# Patient Record
Sex: Female | Born: 1990 | Race: Asian | Hispanic: No | Marital: Single | Smoking: Never smoker
Health system: Southern US, Community
[De-identification: ages and names within clinical notes are randomized; demographics above are authoritative.]

## PROBLEM LIST (undated history)

## (undated) DIAGNOSIS — T7840XA Allergy, unspecified, initial encounter: Secondary | ICD-10-CM

## (undated) HISTORY — DX: Allergy, unspecified, initial encounter: T78.40XA

---
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
## 2013-11-03 ENCOUNTER — Ambulatory Visit (INDEPENDENT_AMBULATORY_CARE_PROVIDER_SITE_OTHER): Payer: BC Managed Care – PPO | Admitting: Physician Assistant

## 2013-11-03 VITALS — BP 118/58 | HR 87 | Temp 98.7°F | Resp 16 | Ht 64.0 in | Wt 151.6 lb

## 2013-11-03 DIAGNOSIS — R059 Cough, unspecified: Secondary | ICD-10-CM

## 2013-11-03 DIAGNOSIS — R05 Cough: Secondary | ICD-10-CM

## 2013-11-03 MED ORDER — HYDROCODONE-HOMATROPINE 5-1.5 MG/5ML PO SYRP: 5.0000 mL | ORAL_SOLUTION | Freq: Three times a day (TID) | ORAL | Status: DC | PRN

## 2013-11-03 MED ORDER — BENZONATATE 100 MG PO CAPS: 100.0000 mg | ORAL_CAPSULE | Freq: Three times a day (TID) | ORAL | Status: DC | PRN

## 2013-11-03 NOTE — Progress Notes (Signed)
   Subjective:    Patient ID: Alexa Perkins, female    DOB: 12-Mar-1991, 23 y.o.   MRN: [id number]  HPI   Alexa Perkins is a very pleasant 23 yr old female here complaining of 1 wk of cough.  For the past 2 days she has been getting into coughing spasms and can't stop coughing.  The cough keeps her awake at night.  Because she has been coughing so much she now has a headache and sore throat.  Her chest hurts because she has been coughing so much.  Cough is non-productive.  Some associated runny nose as well.  No fever.  Dayquil and Nyquil for symptoms, neither of which have helped.  Denies SOB or wheezing.  No asthma history.  No smoking.  Has had some friends sick with similar symptoms.  States that she really feels pretty well, just can't stop coughing.   Review of Systems  Constitutional: Negative for fever and chills.  HENT: Positive for congestion, rhinorrhea and sore throat.   Respiratory: Positive for cough. Negative for shortness of breath and wheezing.   Cardiovascular: Positive for chest pain (with cough).  Gastrointestinal: Negative.   Musculoskeletal: Negative.   Skin: Negative.   Neurological: Positive for headaches.       Objective:   Physical Exam  Vitals reviewed. Constitutional: She is oriented to person, place, and time. She appears well-developed and well-nourished. No distress.  HENT:  Head: Normocephalic and atraumatic.  Right Ear: Tympanic membrane and ear canal normal.  Left Ear: Tympanic membrane and ear canal normal.  Mouth/Throat: Uvula is midline, oropharynx is clear and moist and mucous membranes are normal.  Eyes: Conjunctivae are normal. No scleral icterus.  Neck: Neck supple.  Cardiovascular: Normal rate, regular rhythm and normal heart sounds.   Pulmonary/Chest: Effort normal and breath sounds normal. She has no wheezes. She has no rales.  Lymphadenopathy:    She has no cervical adenopathy.  Neurological: She is alert and oriented to person, place, and  time.  Skin: Skin is warm and dry.  Psychiatric: She has a normal mood and affect. Her behavior is normal.       Assessment & Plan:  Cough - Plan: benzonatate (TESSALON) 100 MG capsule, HYDROcodone-homatropine (HYCODAN) 5-1.5 MG/5ML syrup   Alexa Perkins is a very pleasant 23 yr old female here with with 1 wk of non-productive cough.  Afebrile ,VSS, lungs CTA.  Suspect viral/post-viral cough.  Will treat with Tessalon, Hycodan.  Push fluids, rest.  Use throat drops for sore throat relief.  Discussed RTC precautions  E. Frances FurbishElizabeth Travaughn Vue MHS, PA-C [facility] 1/8/20151:52 PM

## 2013-11-03 NOTE — Patient Instructions (Signed)
Begin using the benzonatate (Tessalon Perles) every 8 hours as needed for cough.  Use the Hycodan syrup at bedtime - will likely make you sleepy, so try only at bed time first.  Drink plenty of water.  Use cough drops/throat drops to help your sore throat.  If anything is worsening or not improving, please let us know

## 2013-11-07 ENCOUNTER — Ambulatory Visit (INDEPENDENT_AMBULATORY_CARE_PROVIDER_SITE_OTHER): Payer: BC Managed Care – PPO | Admitting: Internal Medicine

## 2013-11-07 VITALS — BP 122/78 | HR 84 | Temp 98.8°F | Resp 17 | Ht 64.0 in | Wt 152.6 lb

## 2013-11-07 DIAGNOSIS — R059 Cough, unspecified: Secondary | ICD-10-CM

## 2013-11-07 DIAGNOSIS — R05 Cough: Secondary | ICD-10-CM

## 2013-11-07 DIAGNOSIS — J209 Acute bronchitis, unspecified: Secondary | ICD-10-CM

## 2013-11-07 DIAGNOSIS — H6690 Otitis media, unspecified, unspecified ear: Secondary | ICD-10-CM

## 2013-11-07 MED ORDER — AZITHROMYCIN 500 MG PO TABS: 500.0000 mg | ORAL_TABLET | Freq: Every day | ORAL | Status: DC

## 2013-11-07 MED ORDER — HYDROCODONE-ACETAMINOPHEN 7.5-325 MG/15ML PO SOLN: 10.0000 mL | Freq: Four times a day (QID) | ORAL | Status: DC | PRN

## 2013-11-07 NOTE — Progress Notes (Signed)
   Subjective:    Patient ID: Alexa Perkins, female    DOB: Mar 05, 1991, 23 y.o.   MRN: [id number]  HPI 23 y.o. Female presents to clinic for follow up of cough and sore throat. Was seen 4 days ago for same symptoms and prescribed hycodan and benzonatate. States that she feels some what better but cant seem to get rid of cough. Cough is productive with mucus. Uses Afrin for sinuses every night at bedtime. Symptoms have persisted for past 1-2 weeks. Feels like symptoms are in both chest and throat .   Review of Systems     Objective:   Physical Exam  Vitals reviewed. Constitutional: She appears well-developed and well-nourished. No distress.  HENT:  Head: Normocephalic.  Right Ear: There is swelling and tenderness. Tympanic membrane is bulging. Tympanic membrane is not injected, not erythematous and not retracted. Tympanic membrane mobility is abnormal. A middle ear effusion is present. Decreased hearing is noted.  Left Ear: External ear normal.  Nose: Mucosal edema, rhinorrhea and sinus tenderness present.  Mouth/Throat: Oropharynx is clear and moist.  Cardiovascular: Normal rate, regular rhythm and normal heart sounds.   Pulmonary/Chest: Effort normal and breath sounds normal. She has no wheezes. She has no rales. She exhibits no tenderness.          Assessment & Plan:  Rhinitis Medicamentosa will deal with later Bronchitis/AOM

## 2013-11-07 NOTE — Patient Instructions (Signed)
Otitis Media, Adult Otitis media is redness, soreness, and swelling (inflammation) of the middle ear. Otitis media may be caused by allergies or, most commonly, by infection. Often it occurs as a complication of the common cold. SIGNS AND SYMPTOMS Symptoms of otitis media may include:  Earache.  Fever.  Ringing in your ear.  Headache.  Leakage of fluid from the ear. DIAGNOSIS To diagnose otitis media, your health care provider will examine your ear with an otoscope. This is an instrument that allows your health care provider to see into your ear in order to examine your eardrum. Your health care provider also will ask you questions about your symptoms. TREATMENT  Typically, otitis media resolves on its own within 3 5 days. Your health care provider may prescribe medicine to ease your symptoms of pain. If otitis media does not resolve within 5 days or is recurrent, your health care provider may prescribe antibiotic medicines if he or she suspects that a bacterial infection is the cause. HOME CARE INSTRUCTIONS   Take your medicine as directed until it is gone, even if you feel better after the first few days.  Only take over-the-counter or prescription medicines for pain, discomfort, or fever as directed by your health care provider.  Follow up with your health care provider as directed. SEEK MEDICAL CARE IF:  You have otitis media only in one ear or bleeding from your nose or both.  You notice a lump on your neck.  You are not getting better in 3 5 days.  You feel worse instead of better. SEEK IMMEDIATE MEDICAL CARE IF:   You have pain that is not controlled with medicine.  You have swelling, redness, or pain around your ear or stiffness in your neck.  You notice that part of your face is paralyzed.  You notice that the bone behind your ear (mastoid) is tender when you touch it. MAKE SURE YOU:   Understand these instructions.  Will watch your condition.  Will get help  right away if you are not doing well or get worse. Document Released: 07/18/2004 Document Revised: 08/03/2013 Document Reviewed: 05/10/2013 [facility] Patient Information 2014 [place], [place]. Bronchitis Bronchitis is inflammation of the airways that extend from the windpipe into the lungs (bronchi). The inflammation often causes mucus to develop, which leads to a cough. If the inflammation becomes severe, it may cause shortness of breath. CAUSES  Bronchitis may be caused by:   Viral infections.   Bacteria.   Cigarette smoke.   Allergens, pollutants, and other irritants.  SIGNS AND SYMPTOMS  The most common symptom of bronchitis is a frequent cough that produces mucus. Other symptoms include:  Fever.   Body aches.   Chest congestion.   Chills.   Shortness of breath.   Sore throat.  DIAGNOSIS  Bronchitis is usually diagnosed through a medical history and physical exam. Tests, such as chest X-rays, are sometimes done to rule out other conditions.  TREATMENT  You may need to avoid contact with whatever caused the problem (smoking, for example). Medicines are sometimes needed. These may include:  Antibiotics. These may be prescribed if the condition is caused by bacteria.  Cough suppressants. These may be prescribed for relief of cough symptoms.   Inhaled medicines. These may be prescribed to help open your airways and make it easier for you to breathe.   Steroid medicines. These may be prescribed for those with recurrent (chronic) bronchitis. HOME CARE INSTRUCTIONS  Get plenty of rest.   Drink enough  fluids to keep your urine clear or pale yellow (unless you have a medical condition that requires fluid restriction). Increasing fluids may help thin your secretions and will prevent dehydration.   Only take over-the-counter or prescription medicines as directed by your health care provider.  Only take antibiotics as directed. Make sure you finish them even if  you start to feel better.  Avoid secondhand smoke, irritating chemicals, and strong fumes. These will make bronchitis worse. If you are a smoker, quit smoking. Consider using nicotine gum or skin patches to help control withdrawal symptoms. Quitting smoking will help your lungs heal faster.   Put a cool-mist humidifier in your bedroom at night to moisten the air. This may help loosen mucus. Change the water in the humidifier daily. You can also run the hot water in your shower and sit in the bathroom with the door closed for 5 10 minutes.   Follow up with your health care provider as directed.   Wash your hands frequently to avoid catching bronchitis again or spreading an infection to others.  SEEK MEDICAL CARE IF: Your symptoms do not improve after 1 week of treatment.  SEEK IMMEDIATE MEDICAL CARE IF:  Your fever increases.  You have chills.   You have chest pain.   You have worsening shortness of breath.   You have bloody sputum.  You faint.  You have lightheadedness.  You have a severe headache.   You vomit repeatedly. MAKE SURE YOU:   Understand these instructions.  Will watch your condition.  Will get help right away if you are not doing well or get worse. Document Released: 10/13/2005 Document Revised: 08/03/2013 Document Reviewed: 06/07/2013 [facility] Patient Information 2014 [place], [place].

## 2013-11-07 NOTE — Progress Notes (Signed)
   Subjective:    Patient ID: Alexa Perkins, female    DOB: Mar 08, 1991, 23 y.o.   MRN: [id number]  HPI    Review of Systems     Objective:   Physical Exam        Assessment & Plan:

## 2013-12-24 ENCOUNTER — Ambulatory Visit (INDEPENDENT_AMBULATORY_CARE_PROVIDER_SITE_OTHER): Payer: BC Managed Care – PPO | Admitting: Family Medicine

## 2013-12-24 VITALS — BP 110/70 | HR 87 | Temp 98.1°F | Resp 18 | Ht >= 80 in | Wt 154.5 lb

## 2013-12-24 DIAGNOSIS — J309 Allergic rhinitis, unspecified: Secondary | ICD-10-CM

## 2013-12-24 DIAGNOSIS — J029 Acute pharyngitis, unspecified: Secondary | ICD-10-CM

## 2013-12-24 DIAGNOSIS — J111 Influenza due to unidentified influenza virus with other respiratory manifestations: Secondary | ICD-10-CM

## 2013-12-24 DIAGNOSIS — R05 Cough: Secondary | ICD-10-CM

## 2013-12-24 DIAGNOSIS — R059 Cough, unspecified: Secondary | ICD-10-CM

## 2013-12-24 LAB — POCT RAPID STREP A (OFFICE): Rapid Strep A Screen: NEGATIVE

## 2013-12-24 LAB — POCT INFLUENZA A/B
Influenza A, POC: POSITIVE
Influenza B, POC: NEGATIVE

## 2013-12-24 MED ORDER — HYDROCOD POLST-CHLORPHEN POLST 10-8 MG/5ML PO LQCR: 5.0000 mL | Freq: Every evening | ORAL | Status: DC | PRN

## 2013-12-24 MED ORDER — OSELTAMIVIR PHOSPHATE 75 MG PO CAPS: 75.0000 mg | ORAL_CAPSULE | Freq: Two times a day (BID) | ORAL | Status: DC

## 2013-12-24 NOTE — Progress Notes (Signed)
 Chief Complaint:  Chief Complaint  Patient presents with  . Cough    runny nose, sore throat, & chest hurts from cough. Sx's started 2 days ago    HPI: Alexa Perkins is a 23 y.o. female who is here for sore throat and flu like sxs. She has had runny nose,  low temp 99.5 has taken nasal spray.  She also has had some msk aches and pain.  She has had bronchitis in the past and was given abx in January She also has had chronic allergies, has tried a lot of meds and nasal sprays, there is only one she uses that works. She was told to come back and get evaluated when she was healthy but she did not do this, she is now sick Her nose gets congested and she cannot sleep at night if she does not use this specific nasal spray. Nonsmoker  She works at [employer] and also a  [employer]   Past Medical History  Diagnosis Date  . Allergy    History reviewed. No pertinent past surgical history. History   Social History  . Marital Status: Single    Spouse Name: N/A    Number of Children: N/A  . Years of Education: N/A   Social History Main Topics  . Smoking status: Never Smoker   . Smokeless tobacco: None  . Alcohol Use: No  . Drug Use: No  . Sexual Activity: None   Other Topics Concern  . None   Social History Narrative  . None   History reviewed. No pertinent family history. No Known Allergies Prior to Admission medications   Medication Sig Start Date End Date Taking? Authorizing Provider  aspirin-sod bicarb-citric acid (ALKA-SELTZER) 325 MG TBEF tablet Take 325 mg by mouth every 6 (six) hours as needed.   Yes Historical Provider, MD     ROS: The patient denies fevers, chills, night sweats, unintentional weight loss, chest pain, palpitations, wheezing, dyspnea on exertion, nausea, vomiting, abdominal pain, dysuria, hematuria, melena, numbness, weakness, or tingling.  All other systems have been reviewed and were otherwise negative with the exception of those mentioned in the  HPI and as above.    PHYSICAL EXAM: Filed Vitals:   12/24/13 1350  BP: 110/70  Pulse: 87  Temp: 98.1 F (36.7 C)  Resp: 18   Filed Vitals:   12/24/13 1350  Height: 9\' 1"  (2.769 m)  Weight: 154 lb 8 oz (70.081 kg)   Body mass index is 9.14 kg/(m^2).  General: Alert, no acute distress HEENT:  Normocephalic, atraumatic, oropharynx patent. EOMI, PERRLA, tm nl, eryth boggy nares, no exudates Cardiovascular:  Regular rate and rhythm, no rubs murmurs or gallops.  No Carotid bruits, radial pulse intact. No pedal edema.  Respiratory: Clear to auscultation bilaterally.  No wheezes, rales, or rhonchi.  No cyanosis, no use of accessory musculature GI: No organomegaly, abdomen is soft and non-tender, positive bowel sounds.  No masses. Skin: No rashes. Neurologic: Facial musculature symmetric. Psychiatric: Patient is appropriate throughout our interaction. Lymphatic: No cervical lymphadenopathy Musculoskeletal: Gait intact.   LABS: Results for orders placed in visit on 12/24/13  POCT INFLUENZA A/B      Result Value Ref Range   Influenza A, POC Positive     Influenza B, POC Negative    POCT RAPID STREP A (OFFICE)      Result Value Ref Range   Rapid Strep A Screen Negative  Negative     EKG/XRAY:  Primary read interpreted by Dr. Conley Rolls at [facility].   ASSESSMENT/PLAN: Encounter Diagnoses  Name Primary?  . Sore throat   . Cough   . Influenza with respiratory manifestations Yes  . Allergic rhinitis    Rx  Tamiflu, Tussionex Cont with nasal spray Wants referral to allergist Work note given  F/u prn  Gross sideeffects, risk and benefits, and alternatives of medications d/w patient. Patient is aware that all medications have potential sideeffects and we are unable to predict every sideeffect or drug-drug interaction that may occur.  ,  PHUONG, DO 12/27/2013 11:57 AM

## 2013-12-24 NOTE — Patient Instructions (Signed)

## 2013-12-27 ENCOUNTER — Encounter: Payer: Self-pay | Admitting: Family Medicine

## 2014-02-03 ENCOUNTER — Ambulatory Visit (INDEPENDENT_AMBULATORY_CARE_PROVIDER_SITE_OTHER): Payer: BC Managed Care – PPO | Admitting: Family Medicine

## 2014-02-03 VITALS — BP 122/64 | HR 92 | Temp 99.4°F | Resp 22 | Ht 64.0 in | Wt 154.0 lb

## 2014-02-03 DIAGNOSIS — R509 Fever, unspecified: Secondary | ICD-10-CM

## 2014-02-03 DIAGNOSIS — J029 Acute pharyngitis, unspecified: Secondary | ICD-10-CM

## 2014-02-03 LAB — POCT CBC
Granulocyte percent: 90.3 %G — AB (ref 37–80)
HCT, POC: 40 % (ref 37.7–47.9)
Hemoglobin: 12.4 g/dL (ref 12.2–16.2)
Lymph, poc: 0.6 (ref 0.6–3.4)
MCH, POC: 24.4 pg — AB (ref 27–31.2)
MCHC: 31 g/dL — AB (ref 31.8–35.4)
MCV: 78.6 fL — AB (ref 80–97)
MID (cbc): 0.2 (ref 0–0.9)
MPV: 7.1 fL (ref 0–99.8)
POC Granulocyte: 7.9 — AB (ref 2–6.9)
POC LYMPH PERCENT: 7.3 %L — AB (ref 10–50)
POC MID %: 2.4 %M (ref 0–12)
Platelet Count, POC: 380 10*3/uL (ref 142–424)
RBC: 5.09 M/uL (ref 4.04–5.48)
RDW, POC: 16.5 %
WBC: 8.8 10*3/uL (ref 4.6–10.2)

## 2014-02-03 LAB — POCT RAPID STREP A (OFFICE): Rapid Strep A Screen: NEGATIVE

## 2014-02-03 LAB — POCT INFLUENZA A/B
Influenza A, POC: NEGATIVE
Influenza B, POC: NEGATIVE

## 2014-02-03 MED ORDER — DOXYCYCLINE HYCLATE 100 MG PO CAPS: 100.0000 mg | ORAL_CAPSULE | Freq: Two times a day (BID) | ORAL | Status: DC

## 2014-02-03 MED ORDER — ACETAMINOPHEN 325 MG PO TABS: 1000.0000 mg | ORAL_TABLET | Freq: Once | ORAL | Status: DC

## 2014-02-03 NOTE — Progress Notes (Signed)
[facility] [address], [place] [address] [phone]  Date:  02/03/2014   Name:  Alexa Perkins   DOB:  07-Jan-1991   MRN:  [id number]  PCP:  No PCP Per Patient    Chief Complaint: Fever, Fatigue, Headache, Nasal Congestion and Sore Throat   History of Present Illness:  Alexa Perkins is a 23 y.o. very pleasant female patient who presents with the following: illness, started with sore throat 2 days ago and then began having body aches and fatigue this morning. Took temperature this morning which was 102.0.  Tried [facility] cold and flu + 4 advil throughout the day with no relief.  No sick contacts.  Did not get flu shot this year.  Associated with frontal headache.  Admits dry cough.  Denies rash. Denies diarrhea admits nausea.  Sees allergist who gave her prednisone 10 mg, monelukast 10 mg, levocetirizine and flonase 4 weeks ago to help get her off of Afrin.  Stopped taking all of these medications after 1 week.  Continued flonase until 2 days ago.    She did have flu A back in February of this year.  She does not have any vomiting or dysuria.  She does have some cramps but has her menses right now.   Last took tylenol about 10 hours ago  She is generally healthy  There are no active problems to display for this patient.   Past Medical History  Diagnosis Date  . Allergy     History reviewed. No pertinent past surgical history.  History  Substance Use Topics  . Smoking status: Never Smoker   . Smokeless tobacco: Not on file  . Alcohol Use: No    History reviewed. No pertinent family history.  No Known Allergies  Medication list has been reviewed and updated.  Current Outpatient Prescriptions on File Prior to Visit  Medication Sig Dispense Refill  . aspirin-sod bicarb-citric acid (ALKA-SELTZER) 325 MG TBEF tablet Take 325 mg by mouth every 6 (six) hours as needed.      . chlorpheniramine-HYDROcodone (TUSSIONEX PENNKINETIC ER) 10-8 MG/5ML LQCR Take 5  mLs by mouth at bedtime as needed for cough.  120 mL  0  . oseltamivir (TAMIFLU) 75 MG capsule Take 1 capsule (75 mg total) by mouth 2 (two) times daily.  10 capsule  0   No current facility-administered medications on file prior to visit.    Review of Systems: General: admits body aches, fever, chills Resp- denies sob, cough, wheeze Cardio - denies chest pain, palpitations GI- admits nausea, denies diarrhea GU- on menstrual cycle, denies diff w urination    Physical Examination: Filed Vitals:   02/03/14 1822  BP: 122/64  Pulse: 121  Temp: 99.9 F (37.7 C)  Resp: 22   Filed Vitals:   02/03/14 1822  Height: 5\' 4"  (1.626 m)  Weight: 154 lb (69.854 kg)   Body mass index is 26.42 kg/(m^2). Ideal Body Weight: Weight in (lb) to have BMI = 25: 145.3  GEN: WDWN, NAD, Non-toxic, A & O x 3, looks well HEENT: Atraumatic, Normocephalic. Neck supple. No masses, No LAD. Tender to palp on anterior neck. Throat erythematous with no exudate.  No meningismus Ears and Nose: No external deformity. CV: RRR, No M/G/R. No JVD. No thrill. No extra heart sounds. PULM: CTA B, no wheezes, crackles, rhonchi. No retractions. No resp. distress. No accessory muscle use. ABD: S, NT, ND, +BS. No rebound. No HSM.  Benign exam EXTR: No c/c/e  NEURO Normal gait. CN II-XII intact  PSYCH: Normally interactive. Conversant. Not depressed or anxious appearing.  Calm demeanor.  No rash on skin- checked hands and feet  Results for orders placed in visit on 02/03/14  POCT CBC      Result Value Ref Range   WBC 8.8  4.6 - 10.2 K/uL   Lymph, poc 0.6  0.6 - 3.4   POC LYMPH PERCENT 7.3 (*) 10 - 50 %L   MID (cbc) 0.2  0 - 0.9   POC MID % 2.4  0 - 12 %M   POC Granulocyte 7.9 (*) 2 - 6.9   Granulocyte percent 90.3 (*) 37 - 80 %G   RBC 5.09  4.04 - 5.48 M/uL   Hemoglobin 12.4  12.2 - 16.2 g/dL   HCT, POC 09.840.0  11.937.7 - 47.9 %   MCV 78.6 (*) 80 - 97 fL   MCH, POC 24.4 (*) 27 - 31.2 pg   MCHC 31.0 (*) 31.8 - 35.4  g/dL   RDW, POC 14.716.5     Platelet Count, POC 380  142 - 424 K/uL   MPV 7.1  0 - 99.8 fL  POCT RAPID STREP A (OFFICE)      Result Value Ref Range   Rapid Strep A Screen Negative  Negative  POCT INFLUENZA A/B      Result Value Ref Range   Influenza A, POC Negative     Influenza B, POC Negative     Given tylenol 1,000 mg once in clinic.  Tachycardia resolved Assessment and Plan: Fever, unspecified - Plan: acetaminophen (TYLENOL) tablet 975 mg, POCT CBC, Culture, Group A Strep, POCT Influenza A/B, doxycycline (VIBRAMYCIN) 100 MG capsule  Acute pharyngitis - Plan: POCT rapid strep A, Culture, Group A Strep, POCT Influenza A/B  Alexa Perkins is here today with a spring "flu."  Strep and flu tests negative.  Given time of year and location will cover for RMSF with doxycycline.  She will watch carefully for any sign of worsening and will let me know if not better in the next 1 or 2 days.     Signed Abbe AmsterdamJessica Joaopedro Eschbach, MD

## 2014-02-03 NOTE — Patient Instructions (Signed)
We are going to treat you with an antibiotic called doxycycline.  If you do not feel better in the next couple of days please let me know- Sooner if worse.

## 2014-02-06 LAB — CULTURE, GROUP A STREP: Organism ID, Bacteria: NORMAL

## 2016-05-16 ENCOUNTER — Ambulatory Visit (INDEPENDENT_AMBULATORY_CARE_PROVIDER_SITE_OTHER): Payer: BLUE CROSS/BLUE SHIELD | Admitting: Physician Assistant

## 2016-05-16 ENCOUNTER — Ambulatory Visit (INDEPENDENT_AMBULATORY_CARE_PROVIDER_SITE_OTHER): Payer: BLUE CROSS/BLUE SHIELD

## 2016-05-16 VITALS — BP 130/80 | HR 110 | Temp 101.5°F | Resp 16 | Ht 64.0 in | Wt 172.4 lb

## 2016-05-16 DIAGNOSIS — J189 Pneumonia, unspecified organism: Secondary | ICD-10-CM | POA: Diagnosis not present

## 2016-05-16 DIAGNOSIS — R059 Cough, unspecified: Secondary | ICD-10-CM

## 2016-05-16 DIAGNOSIS — R05 Cough: Secondary | ICD-10-CM

## 2016-05-16 DIAGNOSIS — R509 Fever, unspecified: Secondary | ICD-10-CM | POA: Diagnosis not present

## 2016-05-16 LAB — POCT CBC
Granulocyte percent: 73.9 %G (ref 37–80)
HCT, POC: 39.1 % (ref 37.7–47.9)
Hemoglobin: 13.3 g/dL (ref 12.2–16.2)
Lymph, poc: 1.7 (ref 0.6–3.4)
MCH, POC: 25.9 pg — AB (ref 27–31.2)
MCHC: 33.9 g/dL (ref 31.8–35.4)
MCV: 76.2 fL — AB (ref 80–97)
MID (cbc): 0.5 (ref 0–0.9)
MPV: 6.1 fL (ref 0–99.8)
POC Granulocyte: 6.3 (ref 2–6.9)
POC LYMPH PERCENT: 19.8 %L (ref 10–50)
POC MID %: 6.3 %M (ref 0–12)
Platelet Count, POC: 288 10*3/uL (ref 142–424)
RBC: 5.13 M/uL (ref 4.04–5.48)
RDW, POC: 14.3 %
WBC: 8.5 10*3/uL (ref 4.6–10.2)

## 2016-05-16 LAB — POCT URINALYSIS DIP (MANUAL ENTRY)
Bilirubin, UA: NEGATIVE
Glucose, UA: NEGATIVE
Ketones, POC UA: NEGATIVE
Nitrite, UA: NEGATIVE
Protein Ur, POC: NEGATIVE
Spec Grav, UA: 1.02
Urobilinogen, UA: 0.2
pH, UA: 8.5

## 2016-05-16 MED ORDER — AZITHROMYCIN 250 MG PO TABS: ORAL_TABLET | ORAL | Status: DC

## 2016-05-16 MED ORDER — NAPROXEN 500 MG PO TABS: 500.0000 mg | ORAL_TABLET | Freq: Two times a day (BID) | ORAL | Status: DC

## 2016-05-16 NOTE — Patient Instructions (Signed)
     IF you received an x-ray today, you will receive an invoice from [facility]. Please contact  Radiology at [phone] with questions or concerns regarding your invoice.   IF you received labwork today, you will receive an invoice from [facility]. Please contact [facility] at [phone] with questions or concerns regarding your invoice.   Our billing staff will not be able to assist you with questions regarding bills from these companies.  You will be contacted with the lab results as soon as they are available. The fastest way to get your results is to activate your My Chart account. Instructions are located on the last page of this paperwork. If you have not heard from us regarding the results in 2 weeks, please contact this office.      

## 2016-05-16 NOTE — Progress Notes (Signed)
05/18/2016 8:43 AM   DOB: 06-07-1991 / MRN: [id number]  SUBJECTIVE:  Alexa Perkins is a 25 y.o. female presenting for cough and fever.  Reports the fever started about 24 hours ago.  The cough has been ongoing however has worsened in the last 24 hours.  She denies rigor, diaphoresis, chills.  She has the [employer] Ring in place.   She has No Known Allergies.   She  has a past medical history of Allergy.    She  reports that she has never smoked. She does not have any smokeless tobacco history on file. She reports that she does not drink alcohol or use drugs. She  has no sexual activity history on file. The patient  has no past surgical history on file.  Her family history is not on file.  Review of Systems  Constitutional: Positive for activity change, chills and fever. Negative for diaphoresis and unexpected weight change.  HENT: Negative for congestion and sore throat.   Respiratory: Negative for shortness of breath and wheezing.   Genitourinary: Negative for difficulty urinating.  Skin: Negative for rash.  Neurological: Negative for dizziness.   Review of Systems  Constitutional: Positive for activity change, chills and fever. Negative for diaphoresis and unexpected weight change.  HENT: Negative for congestion and sore throat.   Respiratory: Negative for shortness of breath and wheezing.   Genitourinary: Negative for difficulty urinating.  Skin: Negative for rash.  Neurological: Negative for dizziness.    Problem list and medications reviewed and updated by myself where necessary, and exist elsewhere in the encounter.   OBJECTIVE:  BP 130/80   Pulse (!) 110   Temp (!) 101.5 F (38.6 C) (Oral)   Resp 16   Ht 5\' 4"  (1.626 m)   Wt 172 lb 6.4 oz (78.2 kg)   LMP 05/04/2016   SpO2 99%   BMI 29.59 kg/m   Physical Exam  Constitutional: She is oriented to person, place, and time. She appears well-developed.  Eyes: EOM are normal. Pupils are equal, round, and reactive to light.    Cardiovascular: Normal rate.   Pulmonary/Chest: Effort normal. She has rales (left lower lobe).  Abdominal: She exhibits no distension.  Musculoskeletal: Normal range of motion.  Neurological: She is alert and oriented to person, place, and time. No cranial nerve deficit.  Skin: Skin is warm and dry. She is not diaphoretic.  Psychiatric: She has a normal mood and affect.  Vitals reviewed.   Results for orders placed or performed in visit on 05/16/16 (from the past 72 hour(s))  POCT urinalysis dipstick     Status: Abnormal   Collection Time: 05/16/16  6:51 PM  Result Value Ref Range   Color, UA yellow yellow   Clarity, UA clear clear   Glucose, UA negative negative   Bilirubin, UA negative negative   Ketones, POC UA negative negative   Spec Grav, UA 1.020    Blood, UA trace-intact (A) negative   pH, UA 8.5    Protein Ur, POC negative negative   Urobilinogen, UA 0.2    Nitrite, UA Negative Negative   Leukocytes, UA Trace (A) Negative  POCT CBC     Status: Abnormal   Collection Time: 05/16/16  6:52 PM  Result Value Ref Range   WBC 8.5 4.6 - 10.2 K/uL   Lymph, poc 1.7 0.6 - 3.4   POC LYMPH PERCENT 19.8 10 - 50 %L   MID (cbc) 0.5 0 - 0.9   POC MID %  6.3 0 - 12 %M   POC Granulocyte 6.3 2 - 6.9   Granulocyte percent 73.9 37 - 80 %G   RBC 5.13 4.04 - 5.48 M/uL   Hemoglobin 13.3 12.2 - 16.2 g/dL   HCT, POC 14.7 82.9 - 47.9 %   MCV 76.2 (A) 80 - 97 fL   MCH, POC 25.9 (A) 27 - 31.2 pg   MCHC 33.9 31.8 - 35.4 g/dL   RDW, POC 56.2 %   Platelet Count, POC 288 142 - 424 K/uL   MPV 6.1 0 - 99.8 fL    No results found.  ASSESSMENT AND PLAN  Alexa Perkins was seen today for cough, fever, chills and headache.  Diagnoses and all orders for this visit:  Cough  CAP (community acquired pneumonia) -     azithromycin (ZITHROMAX) 250 MG tablet; Take two on day 1, and one daily thereafter.  Fever, unspecified -     POCT urinalysis dipstick -     POCT CBC -     DG Chest 2 View; Future -      naproxen (NAPROSYN) 500 MG tablet; Take 1 tablet (500 mg total) by mouth 2 (two) times daily with a meal.    The patient was advised to call or return to clinic if she does not see an improvement in symptoms, or to seek the care of the closest emergency department if she worsens with the above plan.   Deliah Boston, MHS, PA-C [facility] 05/18/2016 8:43 AM

## 2016-07-15 ENCOUNTER — Telehealth: Payer: Self-pay

## 2016-07-15 NOTE — Telephone Encounter (Signed)
Patient called requesting after visit summary from 9/21. This has been printed and placed in the RX box at check in. May be picked up by her cousin, but she will call to give consent.   DONE

## 2016-11-03 ENCOUNTER — Ambulatory Visit (INDEPENDENT_AMBULATORY_CARE_PROVIDER_SITE_OTHER): Payer: BLUE CROSS/BLUE SHIELD | Admitting: Urgent Care

## 2016-11-03 VITALS — BP 122/64 | HR 81 | Temp 98.5°F | Ht 64.0 in | Wt 178.0 lb

## 2016-11-03 DIAGNOSIS — J3089 Other allergic rhinitis: Secondary | ICD-10-CM | POA: Diagnosis not present

## 2016-11-03 DIAGNOSIS — R05 Cough: Secondary | ICD-10-CM | POA: Diagnosis not present

## 2016-11-03 DIAGNOSIS — R059 Cough, unspecified: Secondary | ICD-10-CM

## 2016-11-03 MED ORDER — BENZONATATE 100 MG PO CAPS: 100.0000 mg | ORAL_CAPSULE | Freq: Three times a day (TID) | ORAL | 0 refills | Status: AC | PRN

## 2016-11-03 MED ORDER — AZELASTINE HCL 0.1 % NA SOLN: 2.0000 | Freq: Two times a day (BID) | NASAL | 12 refills | Status: AC

## 2016-11-03 MED ORDER — HYDROCODONE-HOMATROPINE 5-1.5 MG/5ML PO SYRP: 5.0000 mL | ORAL_SOLUTION | Freq: Every evening | ORAL | 0 refills | Status: AC | PRN

## 2016-11-03 MED ORDER — PSEUDOEPHEDRINE HCL ER 120 MG PO TB12: 120.0000 mg | ORAL_TABLET | Freq: Two times a day (BID) | ORAL | 3 refills | Status: AC

## 2016-11-03 MED ORDER — FLUTICASONE PROPIONATE 50 MCG/ACT NA SUSP: 2.0000 | Freq: Every day | NASAL | 11 refills | Status: AC

## 2016-11-03 MED ORDER — CETIRIZINE HCL 10 MG PO TABS: 10.0000 mg | ORAL_TABLET | Freq: Every day | ORAL | 11 refills | Status: AC

## 2016-11-03 NOTE — Progress Notes (Signed)
    MRN: [id number] DOB: 1991/05/14  Subjective:   Alexa Perkins is a 26 y.o. female presenting for chief complaint of Cough (X 4 days) and Sore Throat (X 3 days)  Reports 4 day history of mostly dry cough. Cough is worst at night, has coughing fits and interrupts her sleep. She has also had associated throat pain. Admits chronic longstanding nasal congestion, uses Afrin daily despite being advised to stop using this by her allergist. Denies fever, chest pain, shob, wheezing, n/v, abdominal pain, sinus pain, ear pain. Denies history of asthma. Denies smoking cigarettes. Has a history of pneumonia 04/2016.  Alexa Perkins has a current medication list which includes the following prescription(s): nuvaring. Also has No Known Allergies.  Alexa Perkins  has a past medical history of Allergy. Also  has no past surgical history on file.  Objective:   Vitals: BP 122/64 (BP Location: Right Arm, Patient Position: Sitting, Cuff Size: Small)   Pulse 81   Temp 98.5 F (36.9 C) (Oral)   Ht 5\' 4"  (1.626 m)   Wt 178 lb (80.7 kg)   LMP 10/05/2016 (Approximate)   BMI 30.55 kg/m   Physical Exam  Constitutional: She is oriented to person, place, and time. She appears well-developed and well-nourished.  HENT:  TM's intact bilaterally, no effusions or erythema. Nasal turbinates boggy, dry and edematous. Nasal passages patent. No sinus tenderness. Oropharynx clear, mucous membranes moist, dentition in good repair.  Eyes: Right eye exhibits no discharge. Left eye exhibits no discharge.  Neck: Normal range of motion. Neck supple.  Cardiovascular: Normal rate, regular rhythm and intact distal pulses.  Exam reveals no gallop and no friction rub.   No murmur heard. Pulmonary/Chest: No respiratory distress. She has no wheezes. She has no rales.  Lymphadenopathy:    She has no cervical adenopathy.  Neurological: She is alert and oriented to person, place, and time.  Skin: Skin is warm and dry.   Assessment and Plan :   1.  Cough - Likely viral in nature, advised supportive care. - If no improvement or symptoms do not resolve return to clinic in 1 week.   2. Chronic allergic rhinitis due to other allergic trigger, unspecified seasonality - Advised patient restart her allergy medication and make a sincere effort to stop using Afrin. Patient states that she will try this. Consider oral steroid course if her problem persists.  Alexa BambergMario Dayle Sherpa, PA-C [facility] [phone] 11/03/2016 4:01 PM

## 2016-11-03 NOTE — Patient Instructions (Addendum)
Cough, Adult Coughing is a reflex that clears your throat and your airways. Coughing helps to heal and protect your lungs. It is normal to cough occasionally, but a cough that happens with other symptoms or lasts a long time may be a sign of a condition that needs treatment. A cough may last only 2-3 weeks (acute), or it may last longer than 8 weeks (chronic). What are the causes? Coughing is commonly caused by:  Breathing in substances that irritate your lungs.  A viral or bacterial respiratory infection.  Allergies.  Asthma.  Postnasal drip.  Smoking.  Acid backing up from the stomach into the esophagus (gastroesophageal reflux).  Certain medicines.  Chronic lung problems, including COPD (or rarely, lung cancer).  Other medical conditions such as heart failure. Follow these instructions at home: Pay attention to any changes in your symptoms. Take these actions to help with your discomfort:  Take medicines only as told by your health care provider.  If you were prescribed an antibiotic medicine, take it as told by your health care provider. Do not stop taking the antibiotic even if you start to feel better.  Talk with your health care provider before you take a cough suppressant medicine.  Drink enough fluid to keep your urine clear or pale yellow.  If the air is dry, use a cold steam vaporizer or humidifier in your bedroom or your home to help loosen secretions.  Avoid anything that causes you to cough at work or at home.  If your cough is worse at night, try sleeping in a semi-upright position.  Avoid cigarette smoke. If you smoke, quit smoking. If you need help quitting, ask your health care provider.  Avoid caffeine.  Avoid alcohol.  Rest as needed. Contact a health care provider if:  You have new symptoms.  You cough up pus.  Your cough does not get better after 2-3 weeks, or your cough gets worse.  You cannot control your cough with suppressant medicines  and you are losing sleep.  You develop pain that is getting worse or pain that is not controlled with pain medicines.  You have a fever.  You have unexplained weight loss.  You have night sweats. Get help right away if:  You cough up blood.  You have difficulty breathing.  Your heartbeat is very fast. This information is not intended to replace advice given to you by your health care provider. Make sure you discuss any questions you have with your health care provider. Document Released: 04/11/2011 Document Revised: 03/20/2016 Document Reviewed: 12/20/2014 Elsevier Interactive Patient Education  2017 Elsevier Inc.   Allergic Rhinitis Allergic rhinitis is when the mucous membranes in the nose respond to allergens. Allergens are particles in the air that cause your body to have an allergic reaction. This causes you to release allergic antibodies. Through a chain of events, these eventually cause you to release histamine into the blood stream. Although meant to protect the body, it is this release of histamine that causes your discomfort, such as frequent sneezing, congestion, and an itchy, runny nose. What are the causes? Seasonal allergic rhinitis (hay fever) is caused by pollen allergens that may come from grasses, trees, and weeds. Year-round allergic rhinitis (perennial allergic rhinitis) is caused by allergens such as house dust mites, pet dander, and mold spores. What are the signs or symptoms?  Nasal stuffiness (congestion).  Itchy, runny nose with sneezing and tearing of the eyes. How is this diagnosed? Your health care provider can help  you determine the allergen or allergens that trigger your symptoms. If you and your health care provider are unable to determine the allergen, skin or blood testing may be used. Your health care provider will diagnose your condition after taking your health history and performing a physical exam. Your health care provider may assess you for  other related conditions, such as asthma, pink eye, or an ear infection. How is this treated? Allergic rhinitis does not have a cure, but it can be controlled by:  Medicines that block allergy symptoms. These may include allergy shots, nasal sprays, and oral antihistamines.  Avoiding the allergen. Hay fever may often be treated with antihistamines in pill or nasal spray forms. Antihistamines block the effects of histamine. There are over-the-counter medicines that may help with nasal congestion and swelling around the eyes. Check with your health care provider before taking or giving this medicine. If avoiding the allergen or the medicine prescribed do not work, there are many new medicines your health care provider can prescribe. Stronger medicine may be used if initial measures are ineffective. Desensitizing injections can be used if medicine and avoidance does not work. Desensitization is when a patient is given ongoing shots until the body becomes less sensitive to the allergen. Make sure you follow up with your health care provider if problems continue. Follow these instructions at home: It is not possible to completely avoid allergens, but you can reduce your symptoms by taking steps to limit your exposure to them. It helps to know exactly what you are allergic to so that you can avoid your specific triggers. Contact a health care provider if:  You have a fever.  You develop a cough that does not stop easily (persistent).  You have shortness of breath.  You start wheezing.  Symptoms interfere with normal daily activities. This information is not intended to replace advice given to you by your health care provider. Make sure you discuss any questions you have with your health care provider. Document Released: 07/08/2001 Document Revised: 06/13/2016 Document Reviewed: 06/20/2013 Elsevier Interactive Patient Education  2017 [employer]   IF you received an x-ray today, you will  receive an [facility]. Please contact [facility] at [phone] with questions or concerns regarding your invoice.   IF you received labwork today, you will receive an invoice from [facility]. Please contact [facility] at [phone] with questions or concerns regarding your invoice.   Our billing staff will not be able to assist you with questions regarding bills from these companies.  You will be contacted with the lab results as soon as they are available. The fastest way to get your results is to activate your My Chart account. Instructions are located on the last page of this paperwork. If you have not heard from us regarding the results in 2 weeks, please contact this office.

## 2016-11-10 ENCOUNTER — Ambulatory Visit (INDEPENDENT_AMBULATORY_CARE_PROVIDER_SITE_OTHER): Payer: BLUE CROSS/BLUE SHIELD | Admitting: Emergency Medicine

## 2016-11-10 VITALS — BP 134/82 | HR 92 | Temp 98.2°F | Resp 16 | Ht 64.0 in | Wt 179.4 lb

## 2016-11-10 DIAGNOSIS — J3089 Other allergic rhinitis: Secondary | ICD-10-CM

## 2016-11-10 DIAGNOSIS — R05 Cough: Secondary | ICD-10-CM | POA: Diagnosis not present

## 2016-11-10 DIAGNOSIS — J069 Acute upper respiratory infection, unspecified: Secondary | ICD-10-CM

## 2016-11-10 DIAGNOSIS — J310 Chronic rhinitis: Secondary | ICD-10-CM | POA: Insufficient documentation

## 2016-11-10 DIAGNOSIS — R059 Cough, unspecified: Secondary | ICD-10-CM

## 2016-11-10 NOTE — Progress Notes (Signed)
Alexa Perkins 26 y.o.   Chief Complaint  Patient presents with  . Cough    Follow up from 1/8- pt still coughing, nasal congestion is better    HISTORY OF PRESENT ILLNESS: This is a 26 y.o. female here for follow up of cough and flu-like symptoms; seen here last week and started on medications; feels better but still having mild dry cough.  Cough  This is a new problem. The current episode started in the past 7 days. The problem has been gradually improving. The problem occurs hourly. The cough is non-productive. Pertinent negatives include no chest pain, chills, ear pain, eye redness, fever, headaches, hemoptysis, myalgias, nasal congestion, rash, rhinorrhea, sore throat or shortness of breath. 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 Treatments tried: prescribed meds. The treatment provided significant relief. none     Prior to Admission medications   Medication Sig Start Date End Date Taking? Authorizing Provider  azelastine (ASTELIN) 0.1 % nasal spray Place 2 sprays into both nostrils 2 (two) times daily. Use in each nostril as directed 11/03/16  Yes Alexa Bamberg, PA-C  benzonatate (TESSALON) 100 MG capsule Take 1-2 capsules (100-200 mg total) by mouth 3 (three) times daily as needed for cough. 11/03/16  Yes Alexa Bamberg, PA-C  cetirizine (ZYRTEC) 10 MG tablet Take 1 tablet (10 mg total) by mouth daily. 11/03/16  Yes Alexa Bamberg, PA-C  fluticasone (FLONASE) 50 MCG/ACT nasal spray Place 2 sprays into both nostrils daily. 11/03/16  Yes Alexa Bamberg, PA-C  HYDROcodone-homatropine [facility]) 5-1.5 MG/5ML syrup Take 5 mLs by mouth at bedtime as needed. 11/03/16  Yes Alexa Bamberg, PA-C  NUVARING 0.12-0.015 MG/24HR vaginal ring Place 1 application vaginally every 30 (thirty) days. 05/06/16  Yes Historical Provider, MD  pseudoephedrine (SUDAFED 12 HOUR) 120 MG 12 hr tablet Take 1 tablet (120 mg total) by mouth 2 (two) times daily. Patient  not taking: Reported on 11/10/2016 11/03/16   Alexa Bamberg, PA-C    No Known Allergies  There are no active problems to display for this patient.   Past Medical History:  Diagnosis Date  . Allergy     No past surgical history on file.  Social History   Social History  . Marital status: Single    Spouse name: N/A  . Number of children: N/A  . Years of education: N/A   Occupational History  . Not on file.   Social History Main Topics  . Smoking status: Never Smoker  . Smokeless tobacco: Never Used  . Alcohol use 0.6 oz/week    1 Glasses of wine per week  . Drug use: No  . Sexual activity: Not on file   Other Topics Concern  . Not on file   Social History Narrative  . No narrative on file    No family history on file.   Review of Systems  Constitutional: Negative for chills and fever.  HENT: Positive for congestion. Negative for ear pain, rhinorrhea and sore throat.   Eyes: Negative for discharge and redness.  Respiratory: Positive for cough. Negative for hemoptysis, sputum production and shortness of breath.   Cardiovascular: Negative for chest pain.  Gastrointestinal: Negative for nausea and vomiting.  Genitourinary: Negative.   Musculoskeletal: Negative for myalgias.  Skin: Negative for rash.  Neurological: Negative for dizziness and headaches.  Endo/Heme/Allergies: Negative.   Psychiatric/Behavioral: Negative.   All other systems reviewed and are negative.  Vitals:   11/10/16  0857  BP: 134/82  Pulse: 92  Resp: 16  Temp: 98.2 F (36.8 C)     Physical Exam  Constitutional: She is oriented to person, place, and time. She appears well-developed and well-nourished.  HENT:  Head: Normocephalic and atraumatic.  Nose: Nose normal.  Mouth/Throat: Oropharynx is clear and moist.  Eyes: Conjunctivae and EOM are normal. Pupils are equal, round, and reactive to light.  Neck: Normal range of motion. Neck supple.  Cardiovascular: Normal rate, regular rhythm  and normal heart sounds.   Pulmonary/Chest: Effort normal and breath sounds normal.  Abdominal: Soft. There is no tenderness.  Musculoskeletal: Normal range of motion.  Neurological: She is alert and oriented to person, place, and time. No sensory deficit. She exhibits normal muscle tone.  Skin: Skin is warm and dry. Capillary refill takes less than 2 seconds.  Psychiatric: She has a normal mood and affect. Her behavior is normal.  Vitals reviewed.    ASSESSMENT & PLAN: Condition improving. Continue present meds and in addition may take OTC cough meds as needed.  [name] was seen today for cough.  Diagnoses and all orders for this visit:  Cough  Chronic allergic rhinitis due to other allergic trigger, unspecified seasonality  Acute upper respiratory infection    Patient Instructions   Condition improving. Continue present meds. May take OTC cough medications.   IF you received an x-ray today, you will receive an invoice from [facility]. Please contact [facility] at [phone] with questions or concerns regarding your invoice.   IF you received labwork today, you will receive an invoice from [facility]. Please contact [facility] at [phone] with questions or concerns regarding your invoice.   Our billing staff will not be able to assist you with questions regarding bills from these companies.  You will be contacted with the lab results as soon as they are available. The fastest way to get your results is to activate your My Chart account. Instructions are located on the last page of this paperwork. If you have not heard from [place] regarding the results in 2 weeks, please contact this office.      Cough, Adult Introduction A cough helps to clear your throat and lungs. A cough may last only 2-3 weeks (acute), or it may last longer than 8 weeks (chronic). Many different things can cause a cough. A cough may be a sign of an illness or another medical  condition. Follow these instructions at home:  Pay attention to any changes in your cough.  Take medicines only as told by your doctor.  If you were prescribed an antibiotic medicine, take it as told by your doctor. Do not stop taking it even if you start to feel better.  Talk with your doctor before you try using a cough medicine.  Drink enough fluid to keep your pee (urine) clear or pale yellow.  If the air is dry, use a cold steam vaporizer or humidifier in your home.  Stay away from things that make you cough at work or at home.  If your cough is worse at night, try using extra pillows to raise your head up higher while you sleep.  Do not smoke, and try not to be around smoke. If you need help quitting, ask your doctor.  Do not have caffeine.  Do not drink alcohol.  Rest as needed. Contact a doctor if:  You have new problems (symptoms).  You cough up yellow fluid (pus).  Your cough does not get better  after 2-3 weeks, or your cough gets worse.  Medicine does not help your cough and you are not sleeping well.  You have pain that gets worse or pain that is not helped with medicine.  You have a fever.  You are losing weight and you do not know why.  You have night sweats. Get help right away if:  You cough up blood.  You have trouble breathing.  Your heartbeat is very fast. This information is not intended to replace advice given to you by your health care provider. Make sure you discuss any questions you have with your health care provider. Document Released: 06/26/2011 Document Revised: 03/20/2016 Document Reviewed: 12/20/2014  2017 Elsevier    Edwina BarthMiguel Maddi Collar, MD [facility]

## 2016-11-10 NOTE — Patient Instructions (Addendum)
Condition improving. Continue present meds. May take OTC cough medications.   IF you received an x-ray today, you will receive an invoice from [facility]. Please contact [facility] at [phone] with questions or concerns regarding your invoice.   IF you received labwork today, you will receive an invoice from [facility]. Please contact [facility] at [phone] with questions or concerns regarding your invoice.   Our billing staff will not be able to assist you with questions regarding bills from these companies.  You will be contacted with the lab results as soon as they are available. The fastest way to get your results is to activate your My Chart account. Instructions are located on the last page of this paperwork. If you have not heard from us regarding the results in 2 weeks, please contact this office.      Cough, Adult Introduction A cough helps to clear your throat and lungs. A cough may last only 2-3 weeks (acute), or it may last longer than 8 weeks (chronic). Many different things can cause a cough. A cough may be a sign of an illness or another medical condition. Follow these instructions at home:  Pay attention to any changes in your cough.  Take medicines only as told by your doctor.  If you were prescribed an antibiotic medicine, take it as told by your doctor. Do not stop taking it even if you start to feel better.  Talk with your doctor before you try using a cough medicine.  Drink enough fluid to keep your pee (urine) clear or pale yellow.  If the air is dry, use a cold steam vaporizer or humidifier in your home.  Stay away from things that make you cough at work or at home.  If your cough is worse at night, try using extra pillows to raise your head up higher while you sleep.  Do not smoke, and try not to be around smoke. If you need help quitting, ask your doctor.  Do not have caffeine.  Do not drink alcohol.  Rest as needed. Contact  a doctor if:  You have new problems (symptoms).  You cough up yellow fluid (pus).  Your cough does not get better after 2-3 weeks, or your cough gets worse.  Medicine does not help your cough and you are not sleeping well.  You have pain that gets worse or pain that is not helped with medicine.  You have a fever.  You are losing weight and you do not know why.  You have night sweats. Get help right away if:  You cough up blood.  You have trouble breathing.  Your heartbeat is very fast. This information is not intended to replace advice given to you by your health care provider. Make sure you discuss any questions you have with your health care provider. Document Released: 06/26/2011 Document Revised: 03/20/2016 Document Reviewed: 12/20/2014  2017 [employer]

## 2016-12-30 IMAGING — DX DG CHEST 2V
2 series · 2 of 2 positions shown · non-contrast
Comparison: None.

CLINICAL DATA: Cough, fevers and chills.

EXAM:
CHEST  2 VIEW

[chest pa]
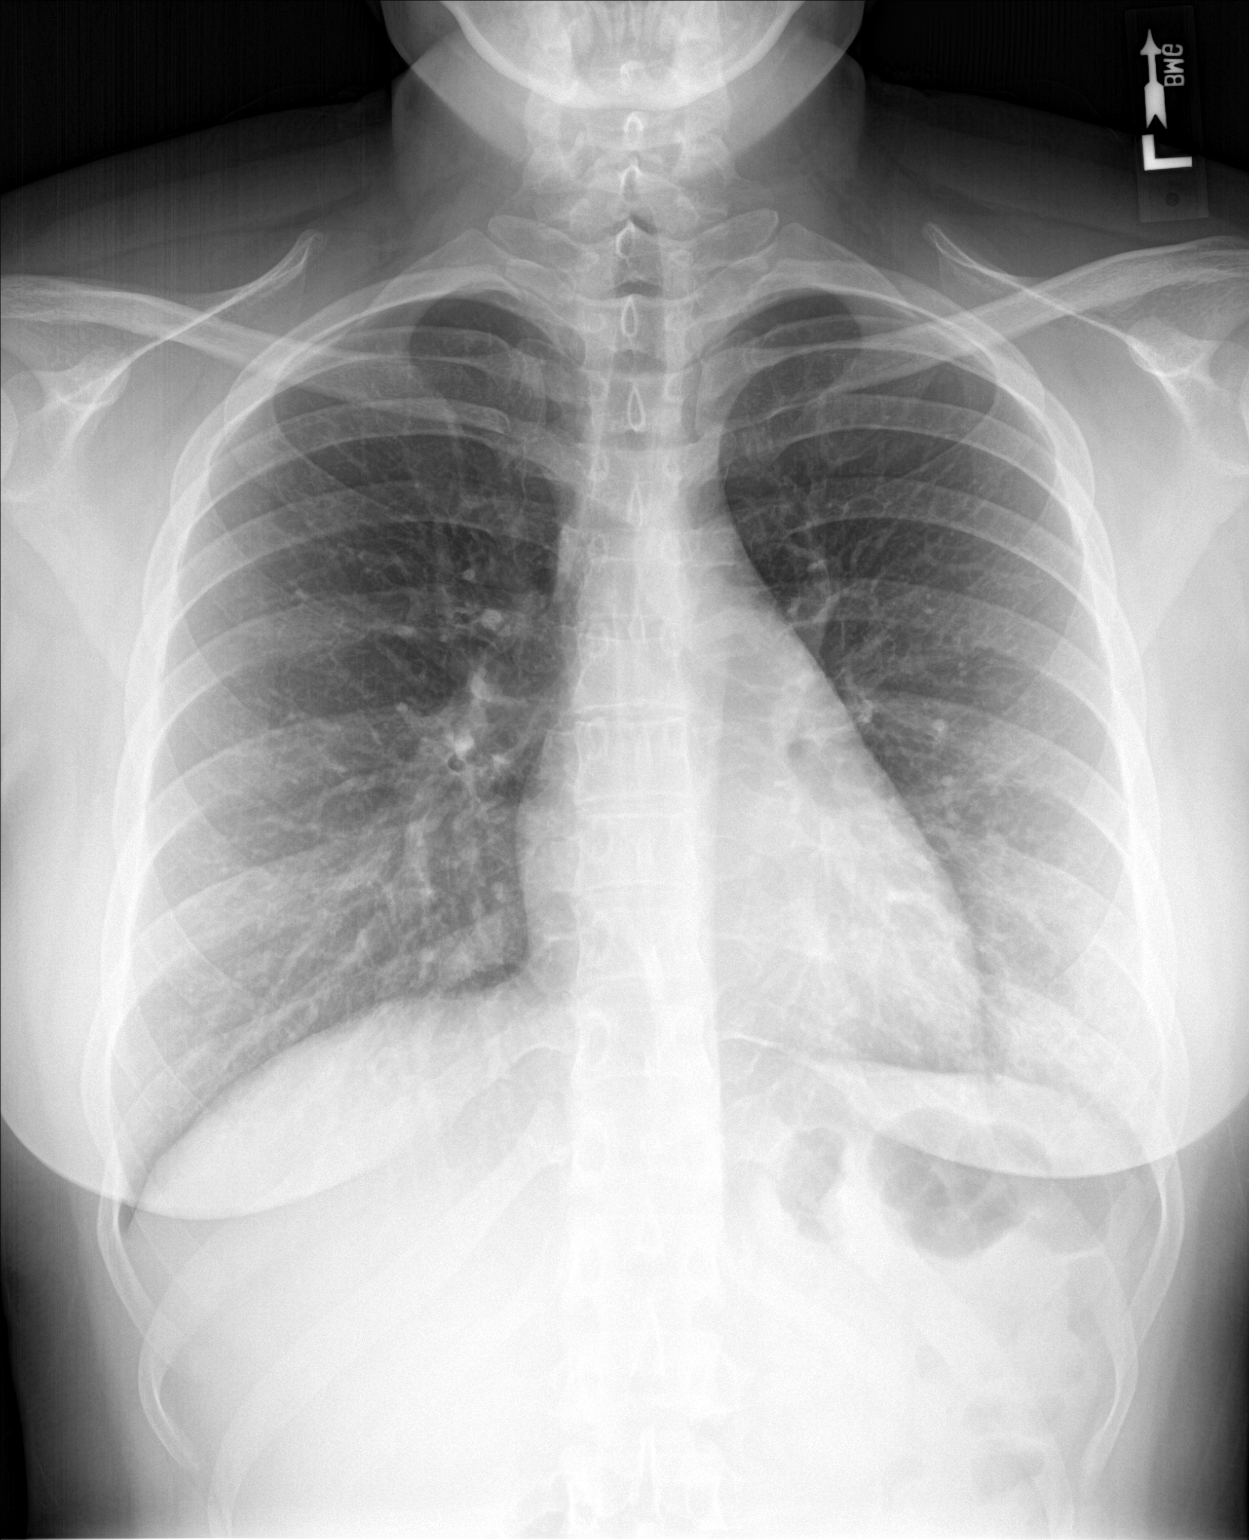

[chest lat]
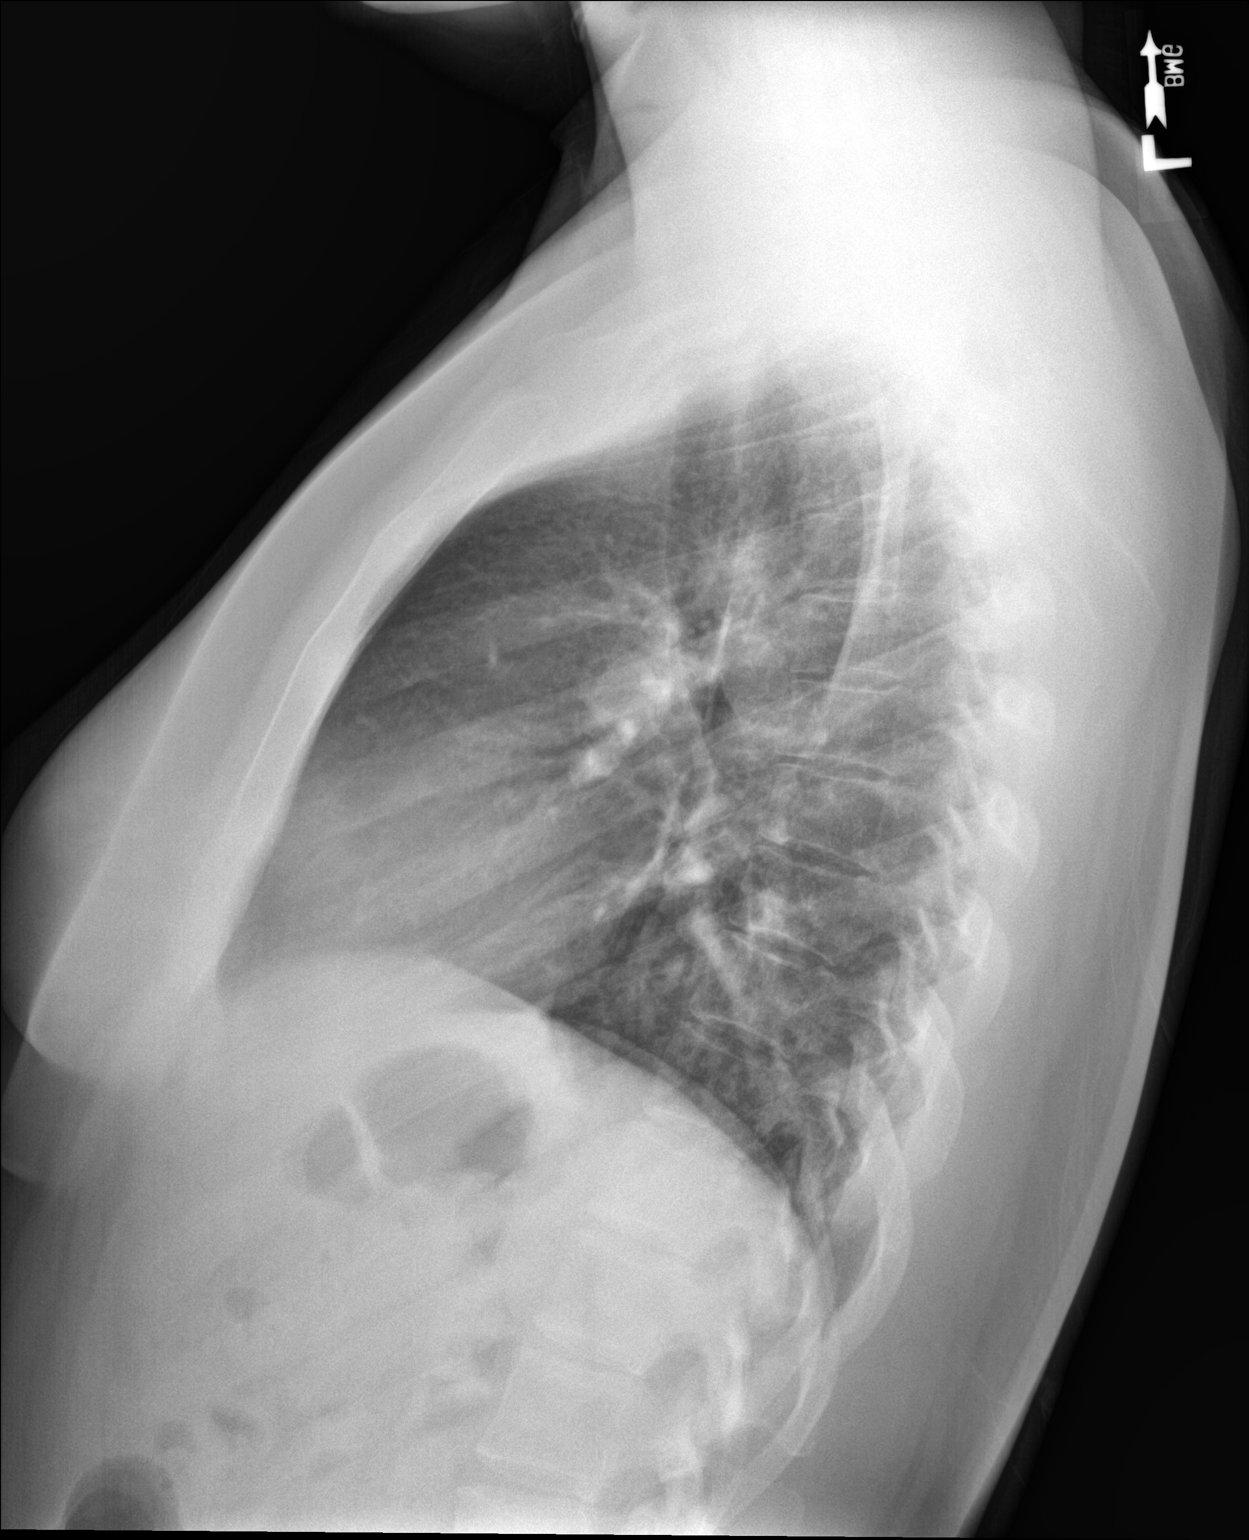

[2 of 2 positions shown; findings below may reference images not displayed]

FINDINGS: Heart size is normal. Mediastinal shadows are normal. The right lung
is clear. I think there is mild patchy infiltrate in the left lower
lobe, with slight indistinctness of left heart border and slight
abnormal density on the lateral view. No dense consolidation or
lobar collapse. No effusions.
IMPRESSION: Suspicion of patchy pneumonia left lower lobe.
